# Patient Record
Sex: Female | Born: 1965 | Race: White | Hispanic: No | Marital: Married | Smoking: Never smoker
Health system: Southern US, Community
[De-identification: ages and names within clinical notes are randomized; demographics above are authoritative.]

## PROBLEM LIST (undated history)

## (undated) HISTORY — PX: BREAST ENHANCEMENT SURGERY: SHX7

---

## 2017-09-18 ENCOUNTER — Emergency Department (HOSPITAL_COMMUNITY)
Admission: EM | Admit: 2017-09-18 | Discharge: 2017-09-18 | Disposition: A | Discharge status: Rehabilitation Facility | Payer: 59 | Attending: Emergency Medicine | Admitting: Emergency Medicine

## 2017-09-18 ENCOUNTER — Encounter (HOSPITAL_COMMUNITY): Payer: Self-pay | Admitting: Emergency Medicine

## 2017-09-18 ENCOUNTER — Other Ambulatory Visit: Payer: Self-pay

## 2017-09-18 ENCOUNTER — Emergency Department (HOSPITAL_COMMUNITY): Payer: 59

## 2017-09-18 DIAGNOSIS — Z79899 Other long term (current) drug therapy: Secondary | ICD-10-CM | POA: Diagnosis not present

## 2017-09-18 DIAGNOSIS — N3001 Acute cystitis with hematuria: Secondary | ICD-10-CM | POA: Insufficient documentation

## 2017-09-18 DIAGNOSIS — R1032 Left lower quadrant pain: Secondary | ICD-10-CM | POA: Diagnosis present

## 2017-09-18 LAB — BASIC METABOLIC PANEL
ANION GAP: 12 (ref 5–15)
BUN: 8 mg/dL (ref 6–20)
CHLORIDE: 102 mmol/L (ref 101–111)
CO2: 25 mmol/L (ref 22–32)
Calcium: 9.5 mg/dL (ref 8.9–10.3)
Creatinine, Ser: 0.68 mg/dL (ref 0.44–1.00)
GFR calc Af Amer: >60 mL/min (ref >60)
GFR calc non Af Amer: >60 mL/min (ref >60)
GLUCOSE: 112 mg/dL — AB (ref 65–99)
Potassium: 3.5 mmol/L (ref 3.5–5.1)
Sodium: 139 mmol/L (ref 135–145)

## 2017-09-18 LAB — CBC
HEMATOCRIT: 35 % — AB (ref 36.0–46.0)
Hemoglobin: 11.6 g/dL — ABNORMAL LOW (ref 12.0–15.0)
MCH: 32.3 pg (ref 26.0–34.0)
MCHC: 33.1 g/dL (ref 30.0–36.0)
MCV: 97.5 fL (ref 78.0–100.0)
Platelets: 172 10*3/uL (ref 150–400)
RBC: 3.59 MIL/uL — ABNORMAL LOW (ref 3.87–5.11)
RDW: 11.8 % (ref 11.5–15.5)
WBC: 6.7 10*3/uL (ref 4.0–10.5)

## 2017-09-18 LAB — URINALYSIS, ROUTINE W REFLEX MICROSCOPIC
Bilirubin Urine: NEGATIVE
Glucose, UA: NEGATIVE mg/dL
Ketones, ur: NEGATIVE mg/dL
Nitrite: POSITIVE — AB
Protein, ur: 100 mg/dL — AB
Specific Gravity, Urine: 1.01 (ref 1.005–1.030)
pH: 7 (ref 5.0–8.0)

## 2017-09-18 MED ORDER — CEPHALEXIN 500 MG PO CAPS
1000.0000 mg | ORAL_CAPSULE | Freq: Once | ORAL | Status: AC
  Administered 2017-09-18: 1000 mg via ORAL
  Filled 2017-09-18: qty 2

## 2017-09-18 MED ORDER — CEPHALEXIN 500 MG PO CAPS: 1000.0000 mg | ORAL_CAPSULE | Freq: Two times a day (BID) | ORAL | 0 refills | Status: AC

## 2017-09-18 NOTE — ED Provider Notes (Signed)
[facility]-EMERGENCY DEPT Provider Note   CSN: [id number] Arrival date & time: 09/18/17  0214     History   Chief Complaint Chief Complaint  Patient presents with  . Flank Pain    HPI Kristie Moore is a 52 y.o. female.  The patient presents with complaint of left flank pain, LLQ abdominal pain and dysuria for the past several days. She has had a low grade fever and nausea with a single episode vomiting tonight. No history of recurrent UTI. No vaginal symptoms, chest pain or SOB. No history of kidney stones.   The history is provided by the patient. No language interpreter was used.  Flank Pain  Associated symptoms include abdominal pain.    History reviewed. No pertinent past medical history.  There are no active problems to display for this patient.   Past Surgical History:  Procedure Laterality Date  . BREAST ENHANCEMENT SURGERY       OB History   None      Home Medications    Prior to Admission medications   Medication Sig Start Date End Date Taking? Authorizing Provider  chlordiazePOXIDE (LIBRIUM) 10 MG capsule Take 10 mg by mouth 4 (four) times daily. For 4 doses   Yes [provider]  diazepam (VALIUM) 5 MG/ML injection Inject 10 mg into the vein stat. As needed for seizures. May repeat dose in 30 seconds if symptoms persist   Yes [provider]  fluticasone (FLONASE) 50 MCG/ACT nasal spray Place 1 spray into both nostrils daily.   Yes [provider]  Multiple Vitamin (MULTIVITAMIN WITH MINERALS) TABS tablet Take 1 tablet by mouth daily.   Yes [provider]  naltrexone (DEPADE) 50 MG tablet Take 25 mg by mouth at bedtime.   Yes [provider]  sertraline (ZOLOFT) 100 MG tablet Take 100 mg by mouth at bedtime.   Yes [provider]  thiamine 100 MG tablet Take 100 mg by mouth daily.   Yes [provider]  traZODone (DESYREL) 50 MG tablet Take 50 mg by mouth at bedtime as  needed for sleep.   Yes [provider]    Family History No family history on file.  Social History Social History   Tobacco Use  . Smoking status: Never Smoker  . Smokeless tobacco: Never Used  Substance Use Topics  . Alcohol use: Yes    Comment: 2 bottles wine/day  . Drug use: Never     Allergies   Patient has no known allergies.   Review of Systems Review of Systems  Constitutional: Positive for fever.  Respiratory: Negative.   Cardiovascular: Negative.   Gastrointestinal: Positive for abdominal pain, nausea and vomiting.  Genitourinary: Positive for dysuria and flank pain.  Neurological: Negative.      Physical Exam Updated Vital Signs BP (!) 139/94 (BP Location: Left Arm)   Pulse 98   Temp 99.7 F (37.6 C) (Oral)   Resp 16   Ht  (1.651 m)   Wt 61.2 kg (135 lb)   SpO2 98%   BMI 22.47 kg/m   Physical Exam  Constitutional: She is oriented to person, place, and time. She appears well-developed and well-nourished.  HENT:  Head: Normocephalic.  Neck: Normal range of motion. Neck supple.  Cardiovascular: Normal rate and regular rhythm.  Pulmonary/Chest: Effort normal and breath sounds normal. She has no wheezes. She has no rales.  Abdominal: Soft. Bowel sounds are normal. There is tenderness (Diffuse abdominal tenderness that refers  to LLQ.). There is no rebound and no guarding.  Musculoskeletal: Normal range of motion.  Neurological: She is alert and oriented to person, place, and time.  Skin: Skin is warm and dry. No rash noted.  Psychiatric: She has a normal mood and affect.     ED Treatments / Results  Labs (all labs ordered are listed, but only abnormal results are displayed) Labs Reviewed  URINALYSIS, ROUTINE W REFLEX MICROSCOPIC - Abnormal; Notable for the following components:      Result Value   Color, Urine AMBER (*)    APPearance CLOUDY (*)    Hgb urine dipstick LARGE (*)    Protein, ur 100 (*)    Nitrite POSITIVE (*)     Leukocytes, UA MODERATE (*)    RBC / HPF >50 (*)    WBC, UA >50 (*)    Bacteria, UA FEW (*)    All other components within normal limits  CBC - Abnormal; Notable for the following components:   RBC 3.59 (*)    Hemoglobin 11.6 (*)    HCT 35.0 (*)    All other components within normal limits  BASIC METABOLIC PANEL - Abnormal; Notable for the following components:   Glucose, Bld 112 (*)    All other components within normal limits  URINE CULTURE    EKG None  Radiology No results found.  Procedures Procedures (including critical care time)  Medications Ordered in ED Medications - No data to display   Initial Impression / Assessment and Plan / ED Course  I have reviewed the triage vital signs and the nursing notes.  Pertinent labs & imaging results that were available during my care of the patient were reviewed by me and considered in my medical decision making (see chart for details).     Patient here with left flank pain and LLQ pain, some dysuria and low grade fever.   She has evidence of UTI. Will obtain CT scan to evaluate for stones, or evidence pyelonephritis. Patient is comfortable at this time.   CT findings c/w UTI. Raises the question of passed stone but no stone seen on CT. Symptoms c/w UTI. She is started on Keflex and is felt to be stable for discharge. Return precautions discussed.   Final Clinical Impressions(s) / ED Diagnoses   Final diagnoses:  None   1. UTI  ED Discharge Orders    None       Elpidio Anis, PA-C 09/18/17 2956    Mancel Bale, MD 09/18/17 [phone]

## 2017-09-18 NOTE — ED Triage Notes (Signed)
Pt from [facility] with c/o left sided flank pain. Pt states symptoms started Wednesday. Pt stated she arrived at [facility] on Tuesday. When pt's labs were run at facility, they confirmed she had a UTI. No antibiotics were given. EMS states they are waiting the results of CNS before starting antibiotics. Pt had nausea and emesis this evening with 1 episode of emesis. Pt has low grade fever of 99.7.   Pt was given  of zofran PTA Pt's last drink was Tuesday morning. Pt denies withdrawal symptoms

## 2017-09-18 NOTE — Discharge Instructions (Signed)
Take Keflex twice daily as prescribed. Continue pyridium for symptomatic relief. Tylenol and/or ibuprofen for fever. Push fluids.

## 2017-09-18 NOTE — ED Notes (Signed)
Report called to [facility]. RN stated they will send someone to come get pt and take her back to facility

## 2017-09-18 NOTE — ED Notes (Signed)
Patient transported to CT 

## 2017-09-20 LAB — URINE CULTURE: Culture: >=100000 — AB

## 2017-09-21 ENCOUNTER — Telehealth: Payer: Self-pay | Admitting: *Deleted

## 2017-09-21 NOTE — Telephone Encounter (Signed)
Post ED Visit - Positive Culture Follow-up  Culture report reviewed by antimicrobial stewardship pharmacist:   Enzo Bi, Pharm.D.  Celedonio Miyamoto, Pharm.D., BCPS AQ-ID  Garvin Fila, Pharm.D., BCPS  Georgina Pillion, Pharm.D., BCPS  [place], [place].D., BCPS, AAHIVP  Estella Husk, Pharm.D., BCPS, AAHIVP  Lysle Pearl, PharmD, BCPS  Sherlynn Carbon, PharmD  Pollyann Samples, PharmD, BCPS Sharin Mons, PharmD  Positive urine culture Treated with Cephalexin, organism sensitive to the same and no further patient follow-up is required at this time.  Sim[place], [facility] 09/21/2017, 10:35 AM

## 2018-12-07 IMAGING — CT CT RENAL STONE PROTOCOL
2 of 4 series · 15 of 46 positions shown, 17 images · non-contrast
Comparison: None.

CLINICAL DATA: 52-year-old female with left flank pain. Concern for
kidney stone.

EXAM:
CT ABDOMEN AND PELVIS WITHOUT CONTRAST
TECHNIQUE: Multidetector CT imaging of the abdomen and pelvis was performed
following the standard protocol without IV contrast.

[Series 2: axial st · axial · 0.64mm/px · z∈[+1135,+1515]mm · 12 of 86 slices shown, 14 images]
[im 5/86  soft-tissue]
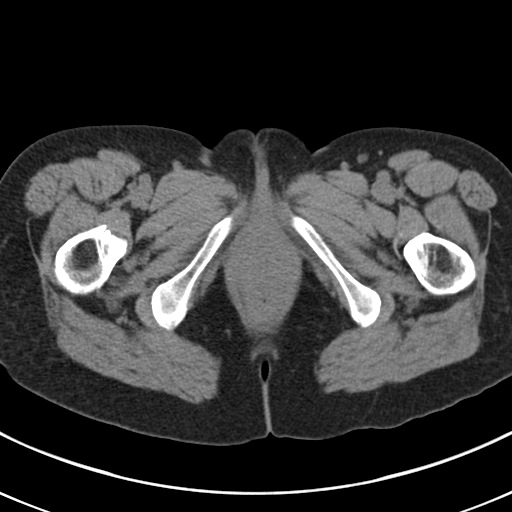
[im 5/86  bone]
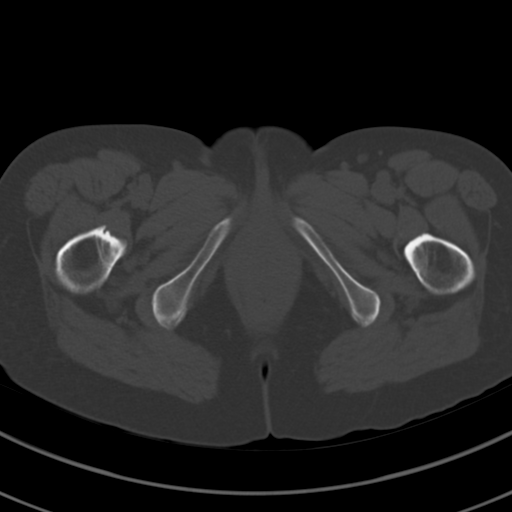
[im 13/86  soft-tissue]
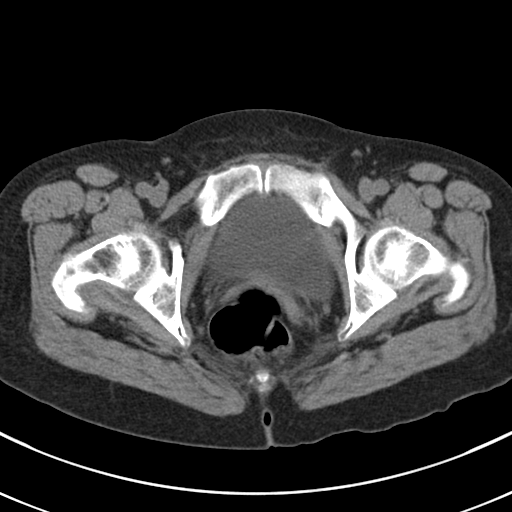
[im 18/86  soft-tissue]
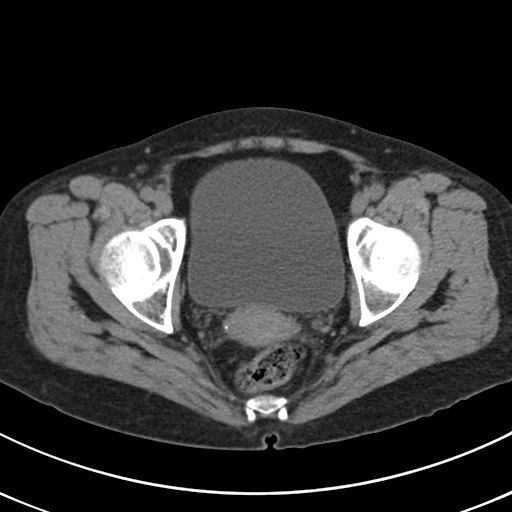
[im 26/86  soft-tissue]
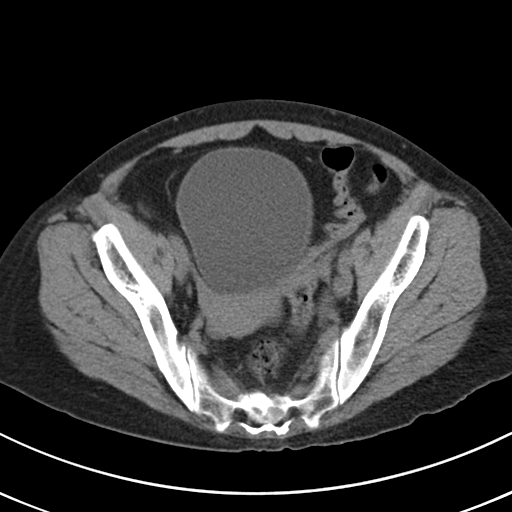
[im 35/86  soft-tissue]
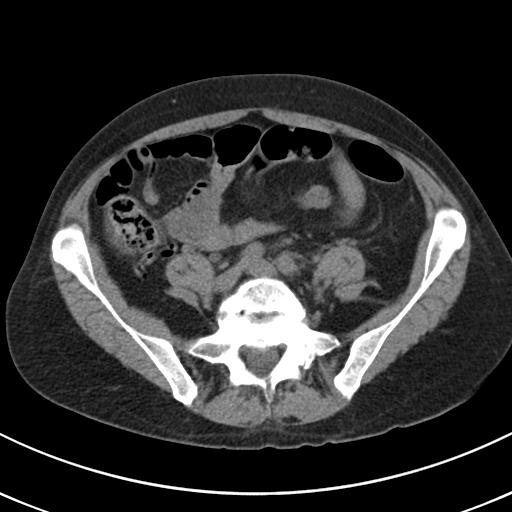
[im 39/86  soft-tissue]
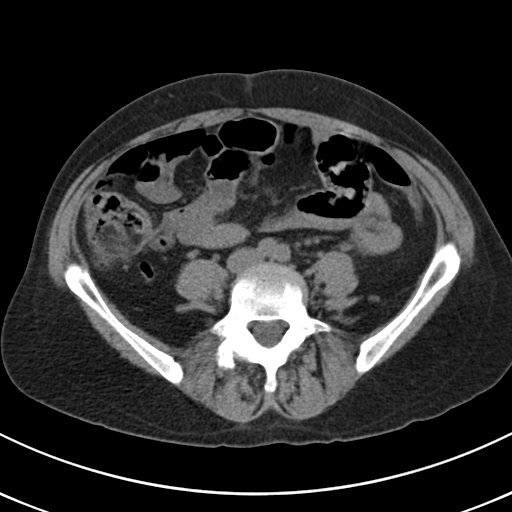
[im 47/86  soft-tissue]
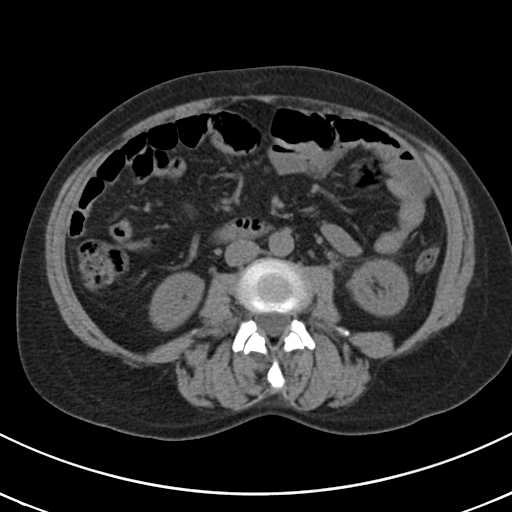
[im 52/86  soft-tissue]
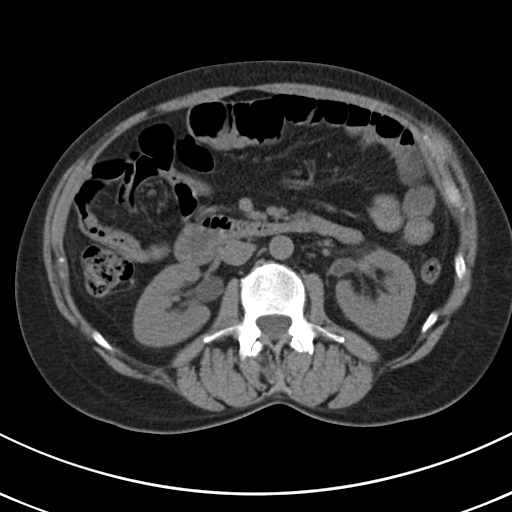
[im 60/86  soft-tissue]
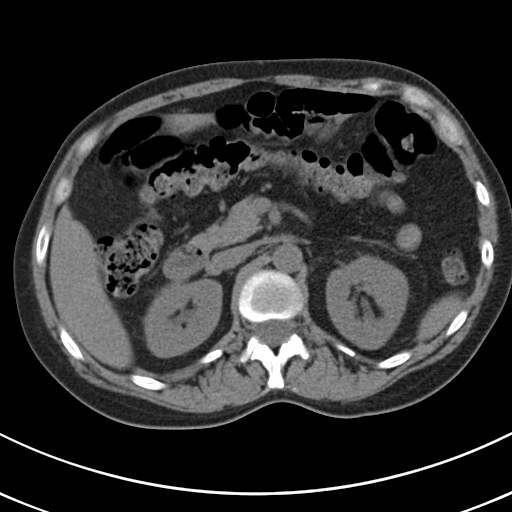
[im 60/86  bone]
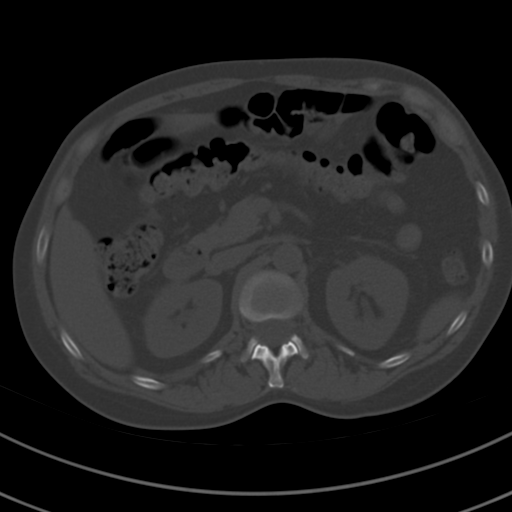
[im 69/86  soft-tissue]
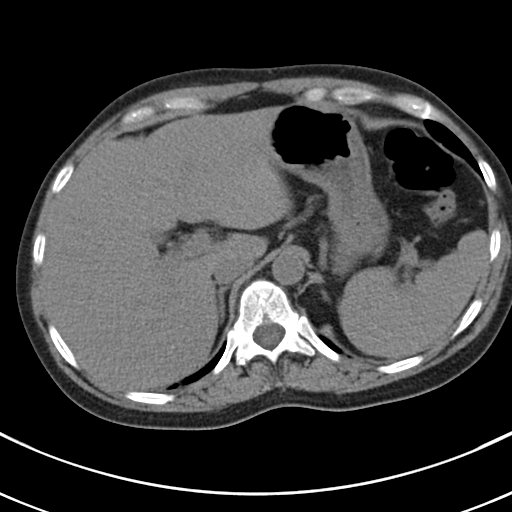
[im 73/86  soft-tissue]
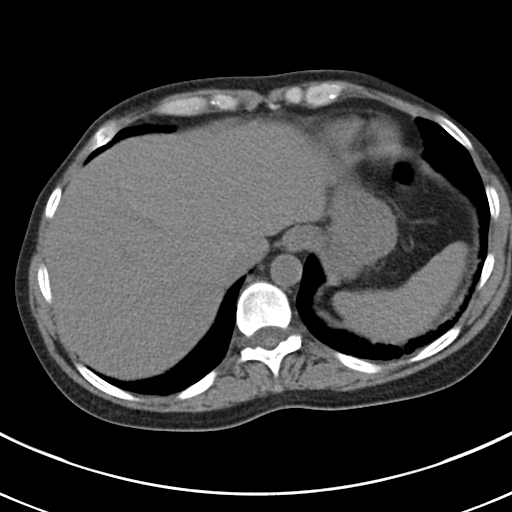
[im 81/86  soft-tissue]
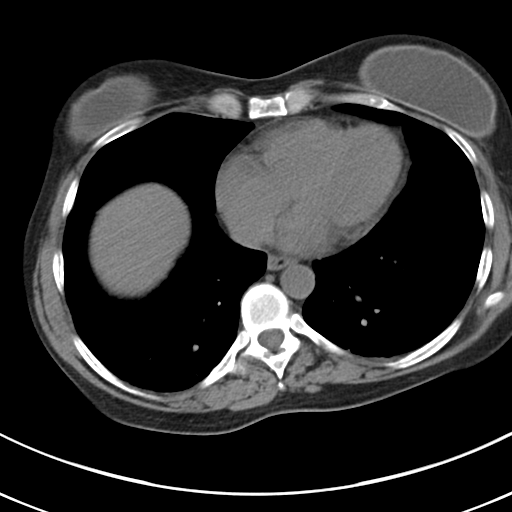

[Series 5: coronal · coronal · 0.65mm/px · 3 of 120 slices shown]
[im 40/120  soft-tissue]
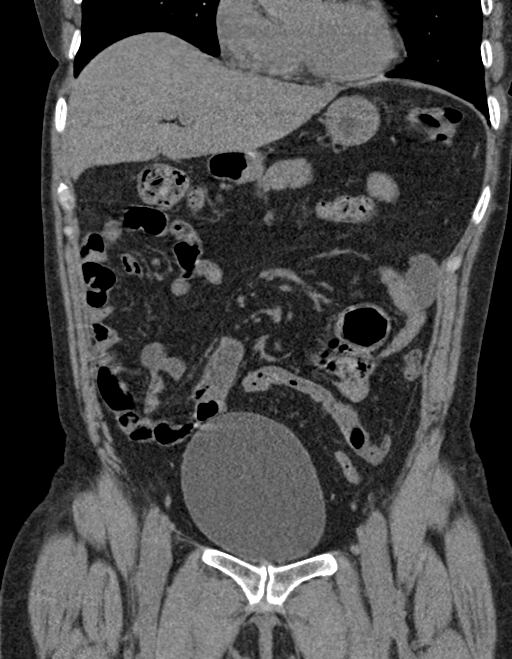
[im 53/120  soft-tissue]
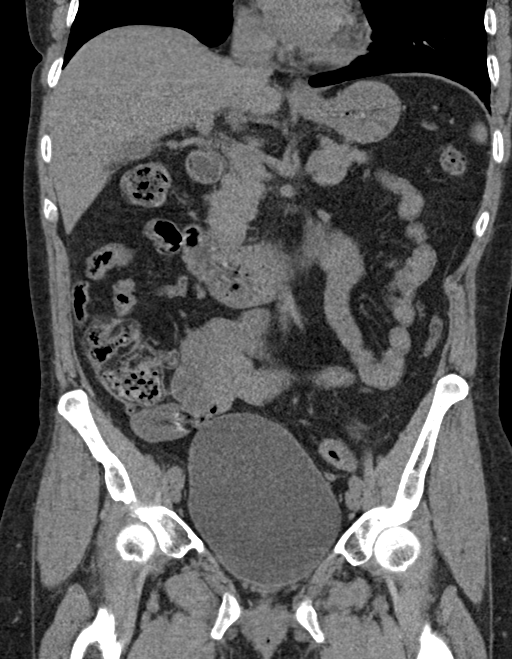
[im 67/120  soft-tissue]
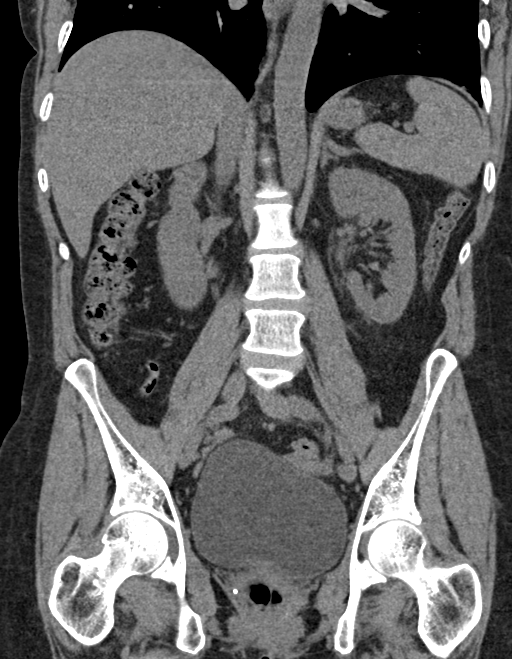

[15 of 46 positions shown; findings below may reference images not displayed]

FINDINGS: Evaluation of this exam is limited in the absence of intravenous
contrast.

Lower chest: The visualized lung bases are clear.

No intra-abdominal free air or free fluid.

Hepatobiliary: Diffuse fatty infiltration of the liver. No
intrahepatic biliary ductal dilatation. The gallbladder is
unremarkable.

Pancreas: Unremarkable. No pancreatic ductal dilatation or
surrounding inflammatory changes.

Spleen: Normal in size without focal abnormality.

Adrenals/Urinary Tract: The adrenal glands are unremarkable. The
right kidney is unremarkable. There is mild stranding of the left
periureteric fat with minimal fullness of the left renal pelvis.
Findings may be related to a recently passed left renal stone versus
urinary tract infection. Correlation with urinalysis recommended. No
renal calculus identified. The urinary bladder is unremarkable.

Stomach/Bowel: Stomach is within normal limits. Appendix appears
normal. No evidence of bowel wall thickening, distention, or
inflammatory changes.

Vascular/Lymphatic: The abdominal aorta and IVC are grossly
unremarkable on this noncontrast CT. No portal venous gas. There is
no adenopathy.

Reproductive: Probable small uterine fibroids. The ovaries are
grossly unremarkable as visualized.

Other: Small fat containing umbilical hernia. Bilateral breast
implants partially visualized.

Musculoskeletal: Degenerative changes primarily at L5-S1. There is a
3.9 x 2.3 cm low attenuating lesion in the sacrum extending to the
neural foramina most consistent with a Tarlov cyst. Further
evaluation with MRI recommended. No acute osseous pathology.
IMPRESSION: 1. Minimal fullness of the left renal pelvis with mild stranding of
the fat adjacent to the left ureteropelvic junction. Findings likely
represent a recently passed stone versus urinary tract infection.
Correlation with urinalysis recommended. No stone identified.
2. Fatty liver.
3. No bowel obstruction or active inflammation.  Normal appendix.
4. Probable sacral Tarlov cyst. Further evaluation with MRI
recommended.
